# Patient Record
Sex: Male | Born: 1995 | Race: White | Hispanic: No | Marital: Single
Health system: Southern US, Community
[De-identification: ages and names within clinical notes are randomized; demographics above are authoritative.]

---
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
## 2015-08-31 IMAGING — CR DG CHEST 1V PORT
1 series · 1 of 1 positions shown · non-contrast
Comparison: Yesterday at 3909 hours

CLINICAL DATA: Pneumothorax, right chest tube in place.

EXAM:
PORTABLE CHEST - 1 VIEW

[ap]
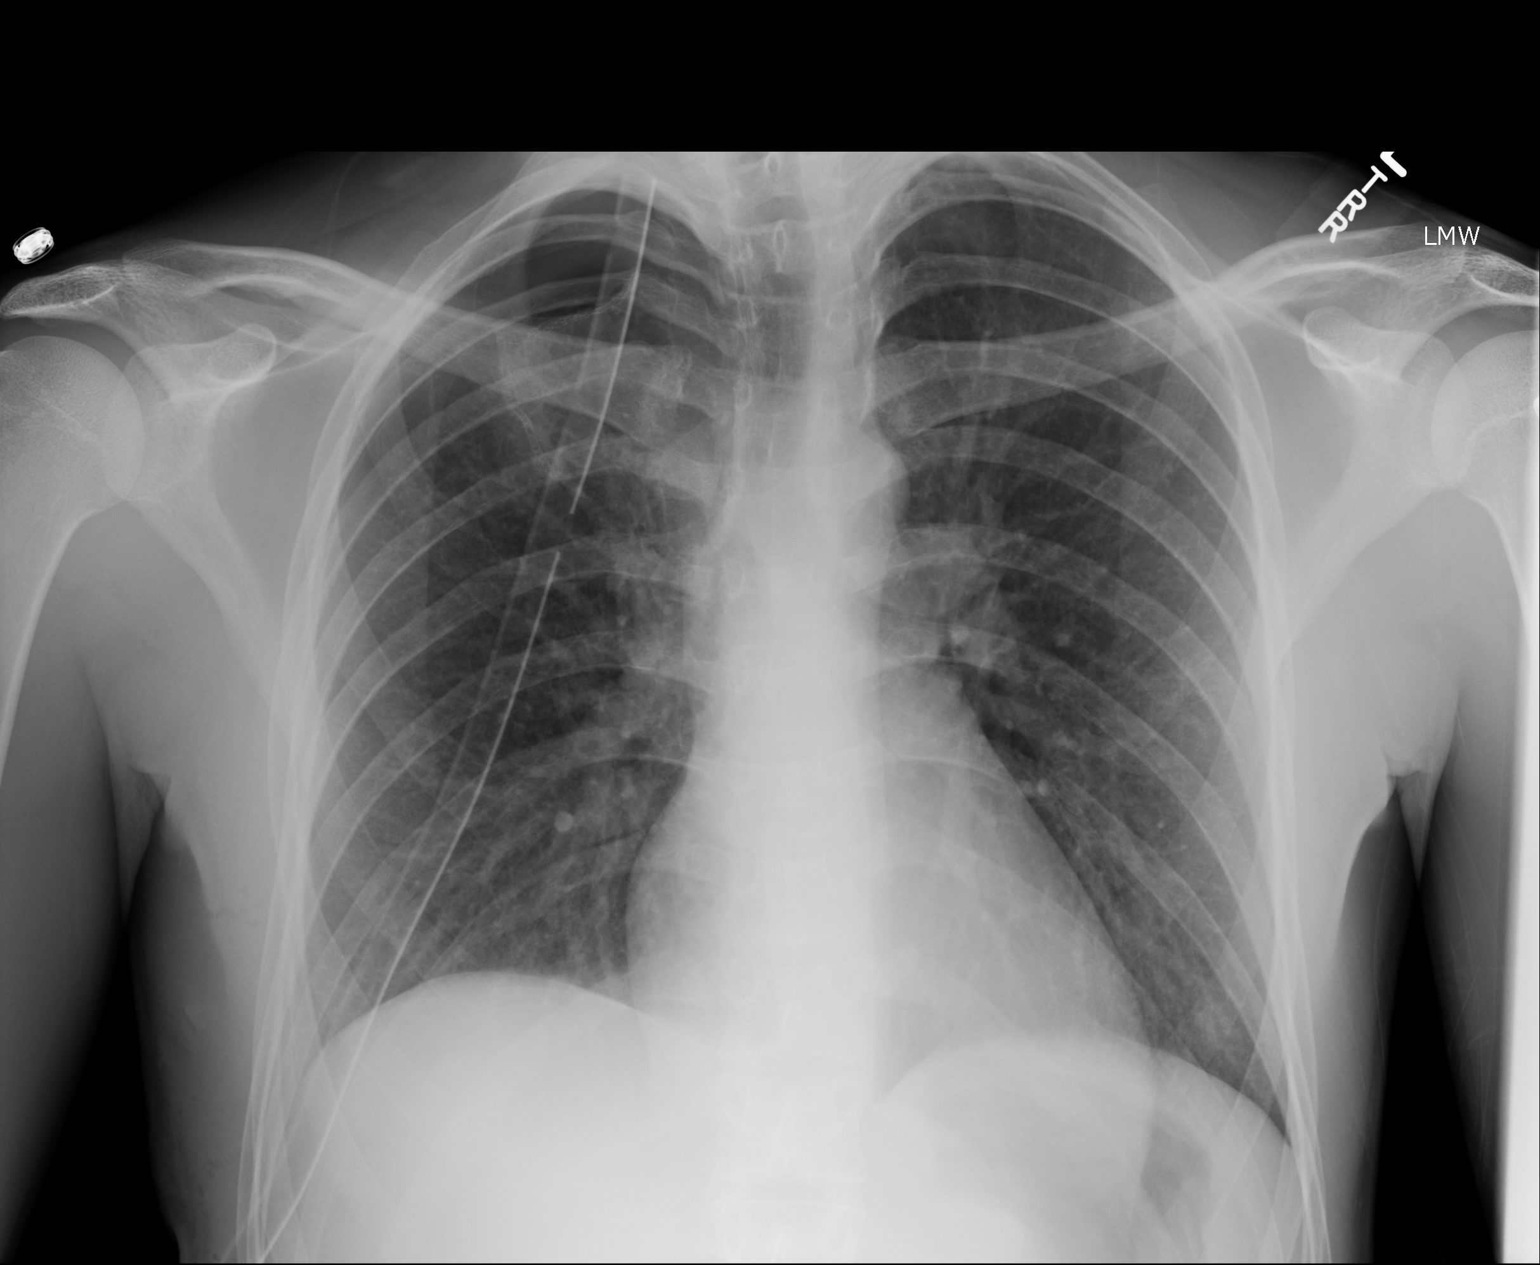

[1 of 1 positions shown; findings below may reference images not displayed]

FINDINGS: Right chest tube in place, tip at the apex. Chain sutures at the
right lung apex. Small apical pneumothorax remains small, however
has minimally increased from prior. There is no mediastinal shift.
The lungs are otherwise clear.
IMPRESSION: Small right apical pneumothorax, minimal increase in size compared
to prior exam. Right chest tube remains in place.

## 2015-09-11 IMAGING — CR DG CHEST 2V
1 series · 2 of 2 positions shown · non-contrast
Comparison: 05/23/2014

CLINICAL DATA: Right-sided chest pain for 2 hr after picking of the
neck. Recent pneumothorax.

EXAM:
CHEST  2 VIEW

[Series 1: dxr chest pa (or ap) and lateral · 0.14mm/px · 2 of 2 slices shown]
[im 1/2]
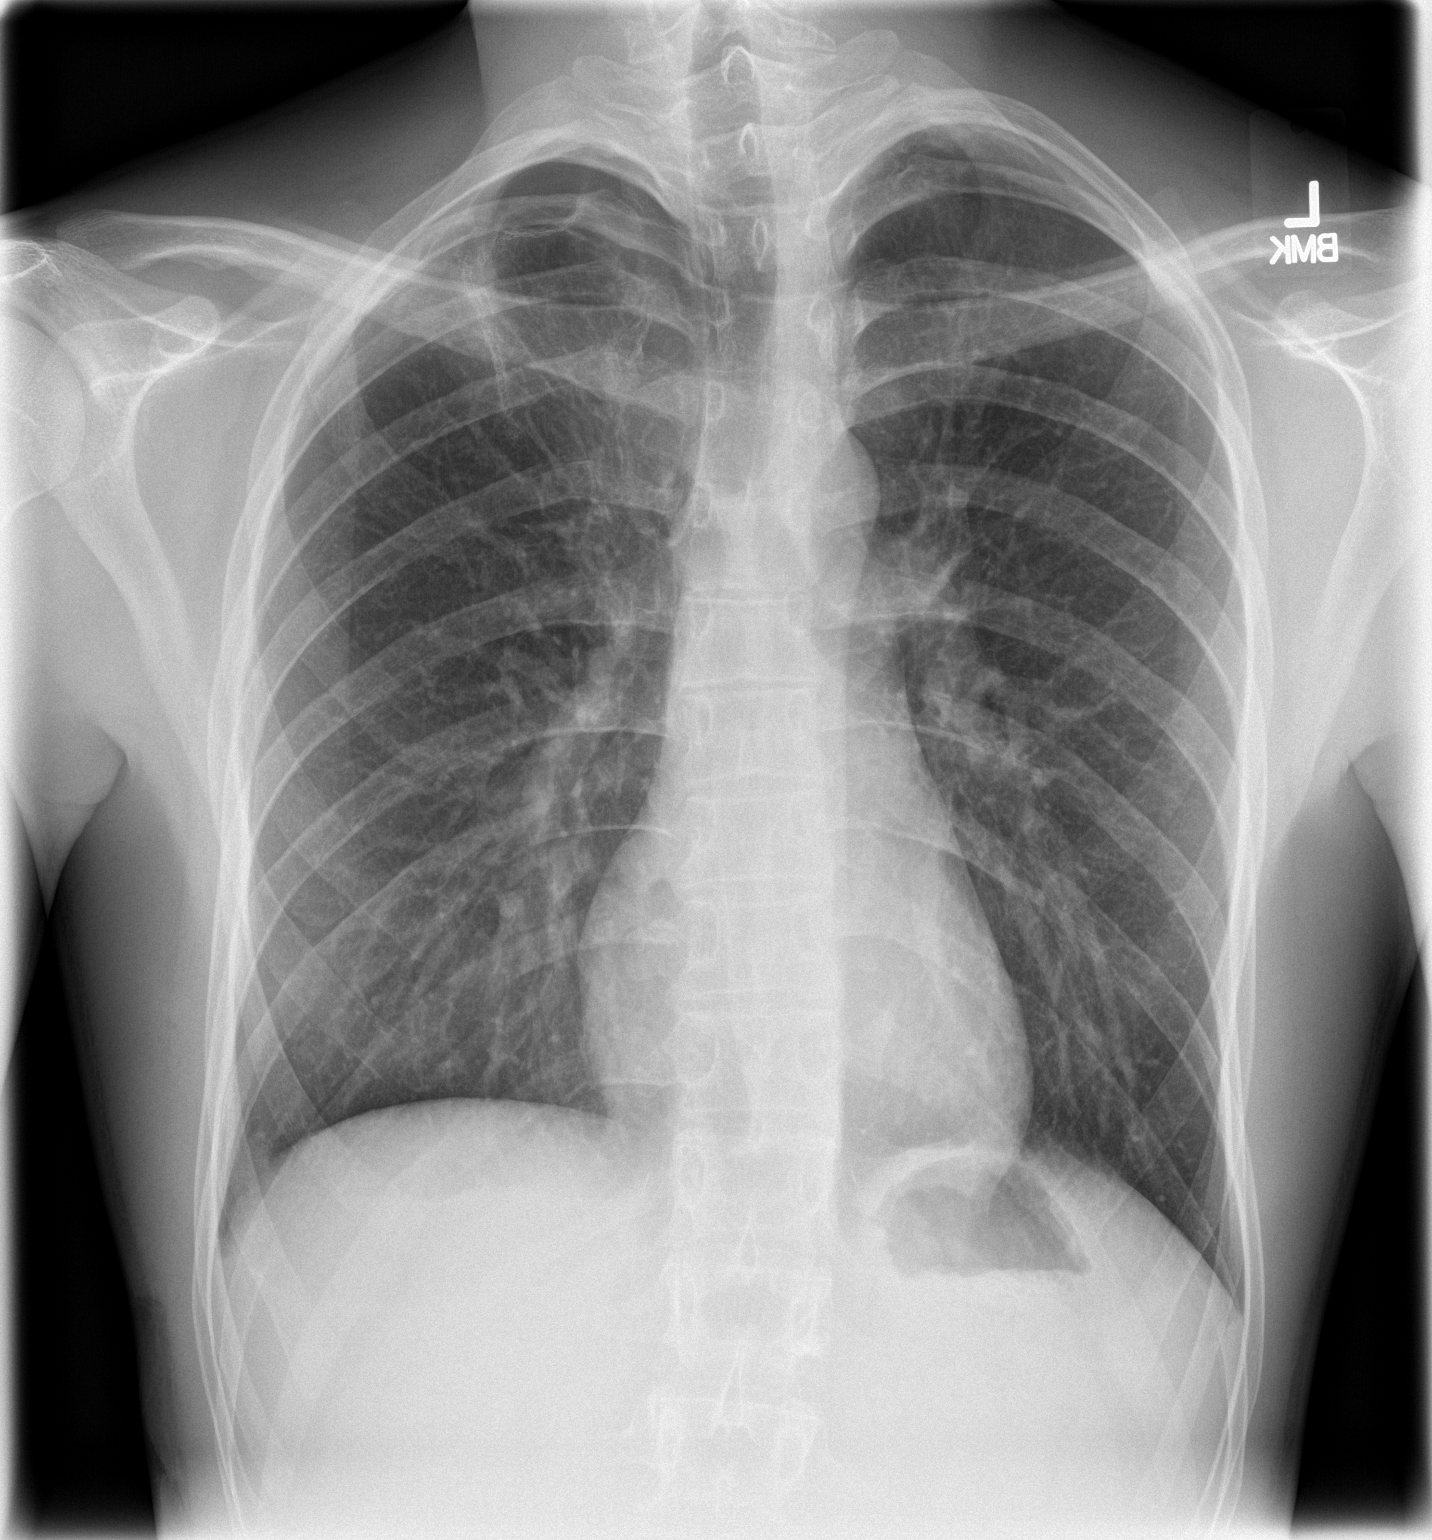
[im 2/2]
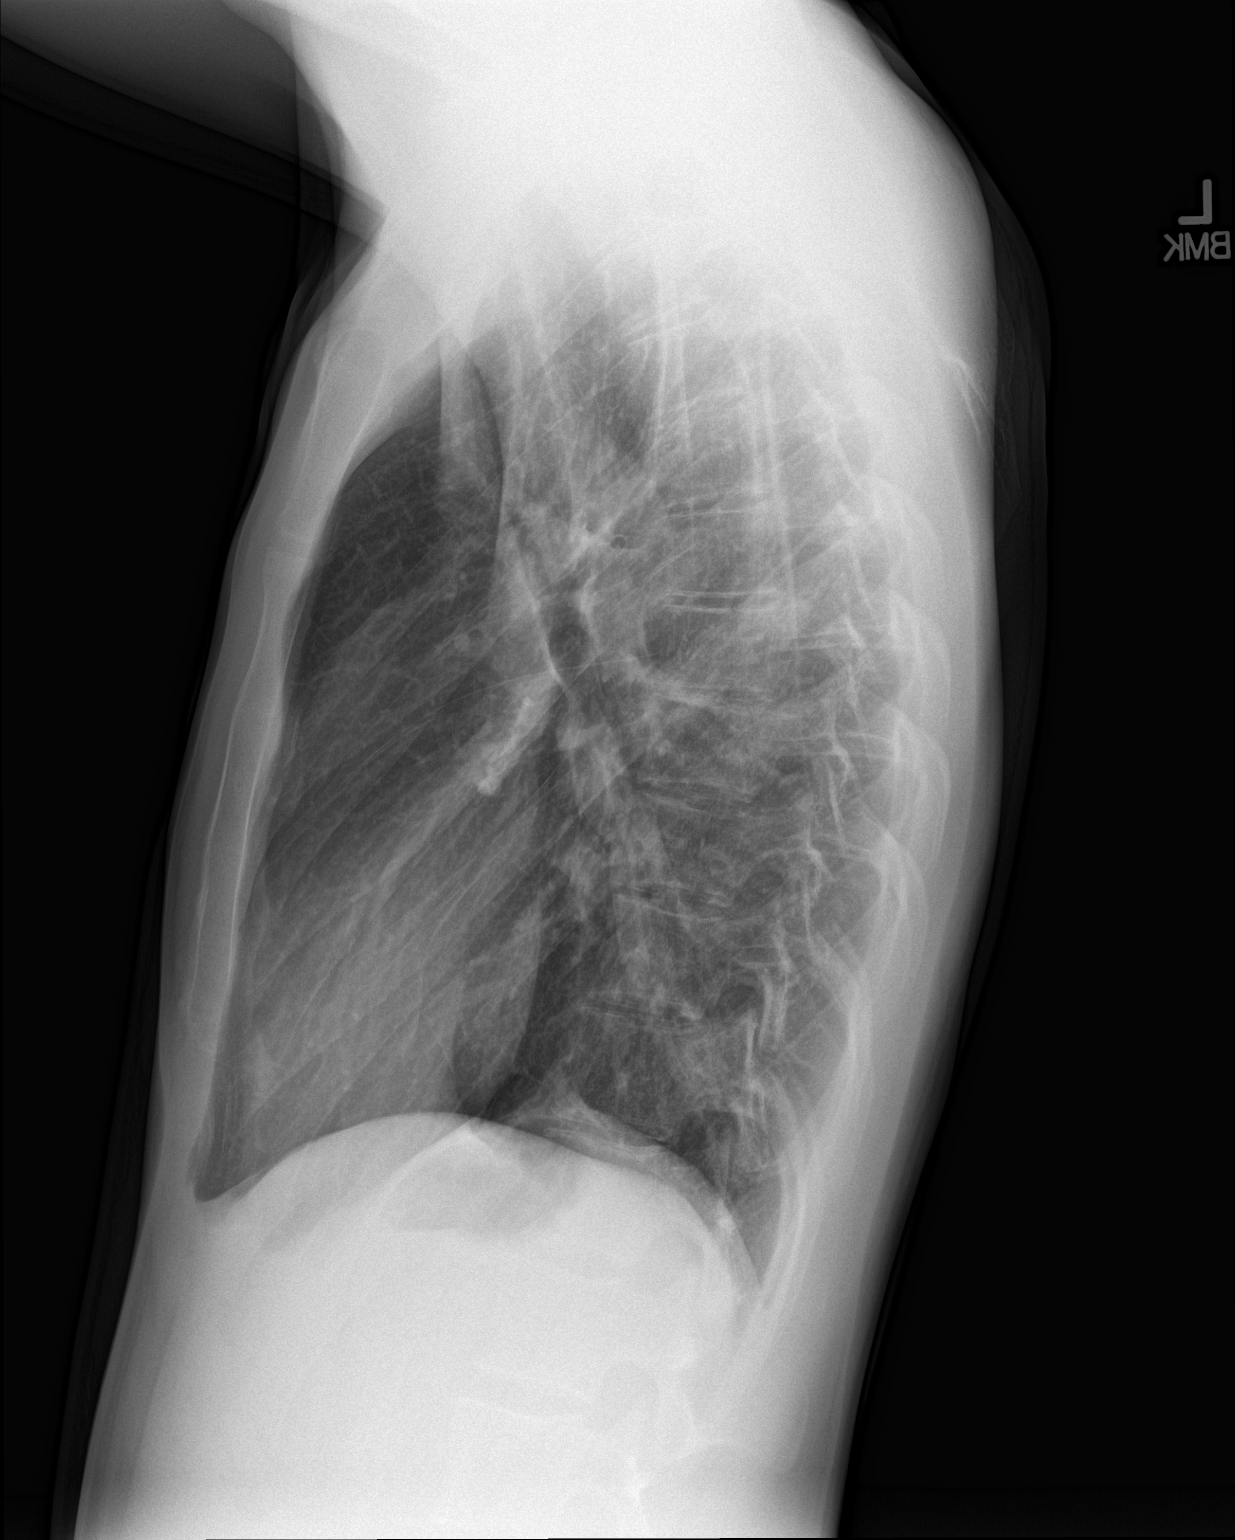

[2 of 2 positions shown; findings below may reference images not displayed]

FINDINGS: Trace right apical pneumothorax, less than 5%. Chain sutures noted
at the right apex.

Normal heart size and aortic contours.  Normal left lung.

No acute osseous findings.

Critical Value/emergent results were called by telephone at the time
of interpretation on 06/03/2014 at [DATE] to Dr. SERENA VITALE , who
verbally acknowledged these results.
IMPRESSION: Trace right apical pneumothorax.

## 2015-09-12 IMAGING — CR DG CHEST 2V
1 series · 2 of 2 positions shown · non-contrast
Comparison: 06/03/2014; 05/23/2014

CLINICAL DATA: History of pneumothorax, post bullectomy.

EXAM:
CHEST  2 VIEW

[Series 1: dxr chest pa (or ap) and lateral · 0.14mm/px · 2 of 2 slices shown]
[im 1/2]
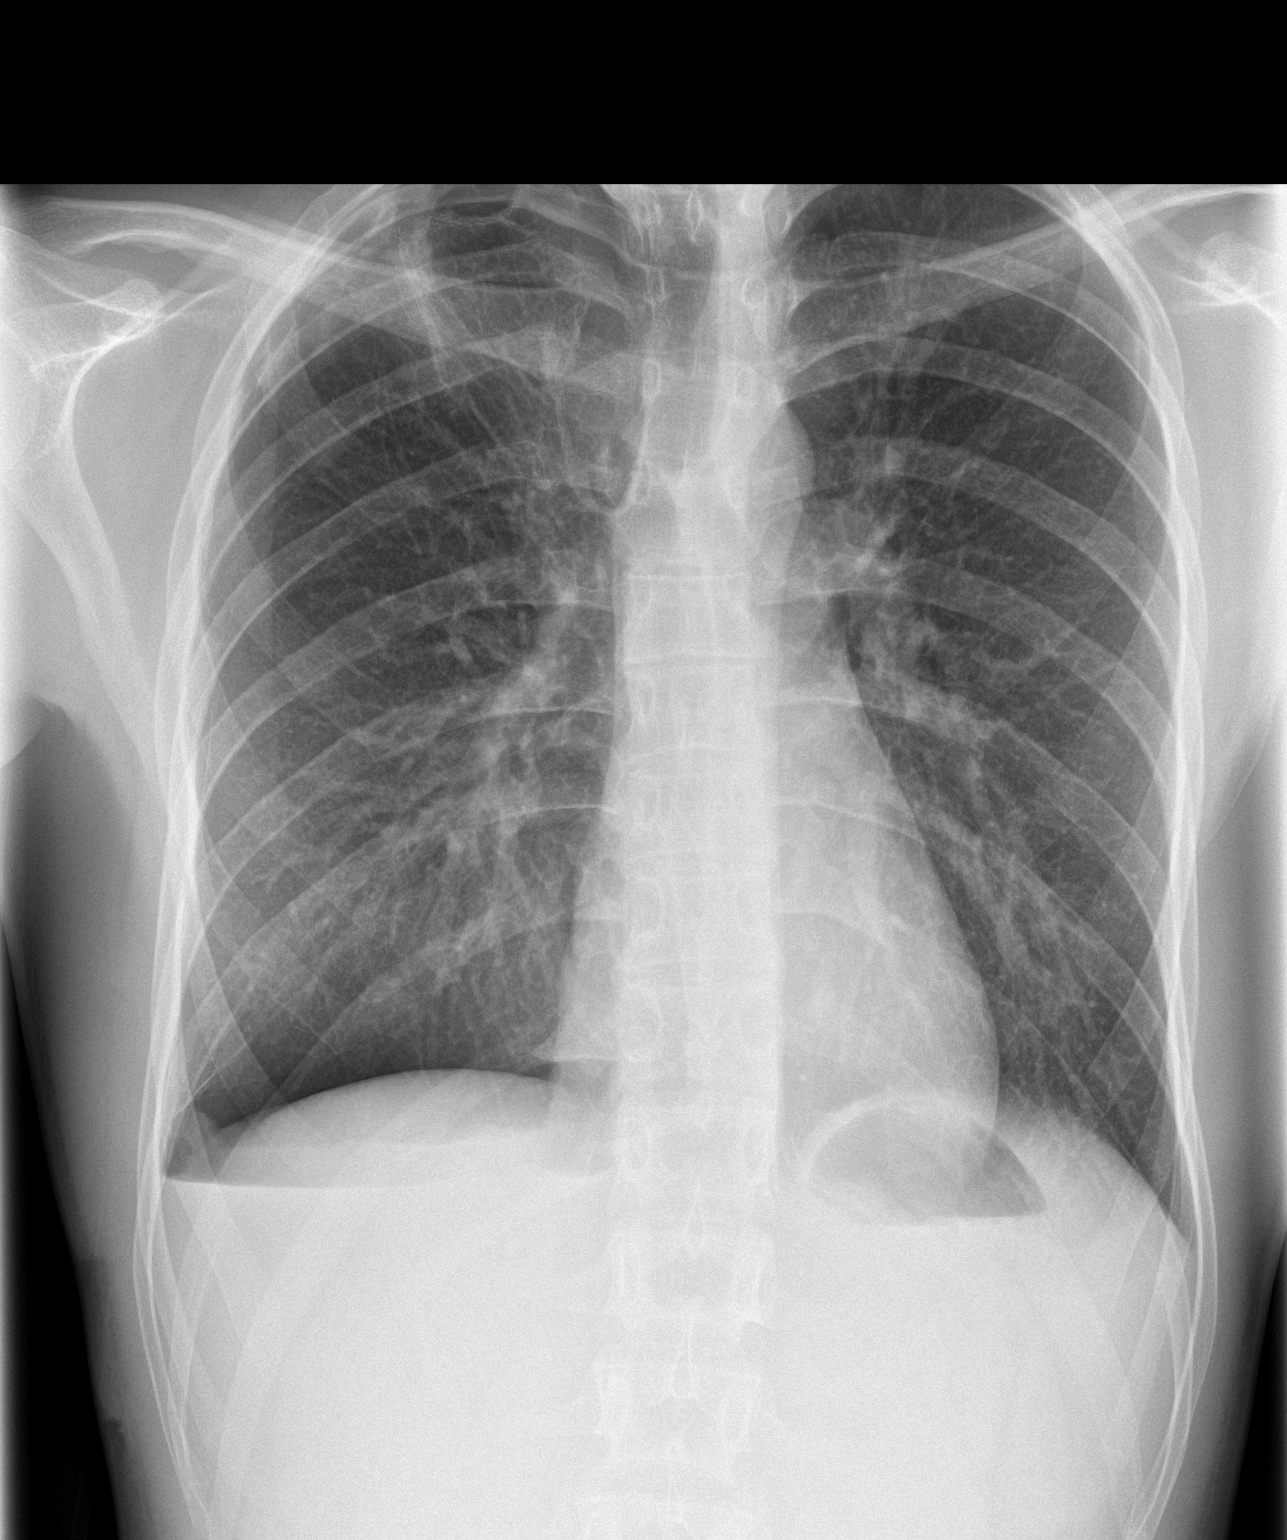
[im 2/2]
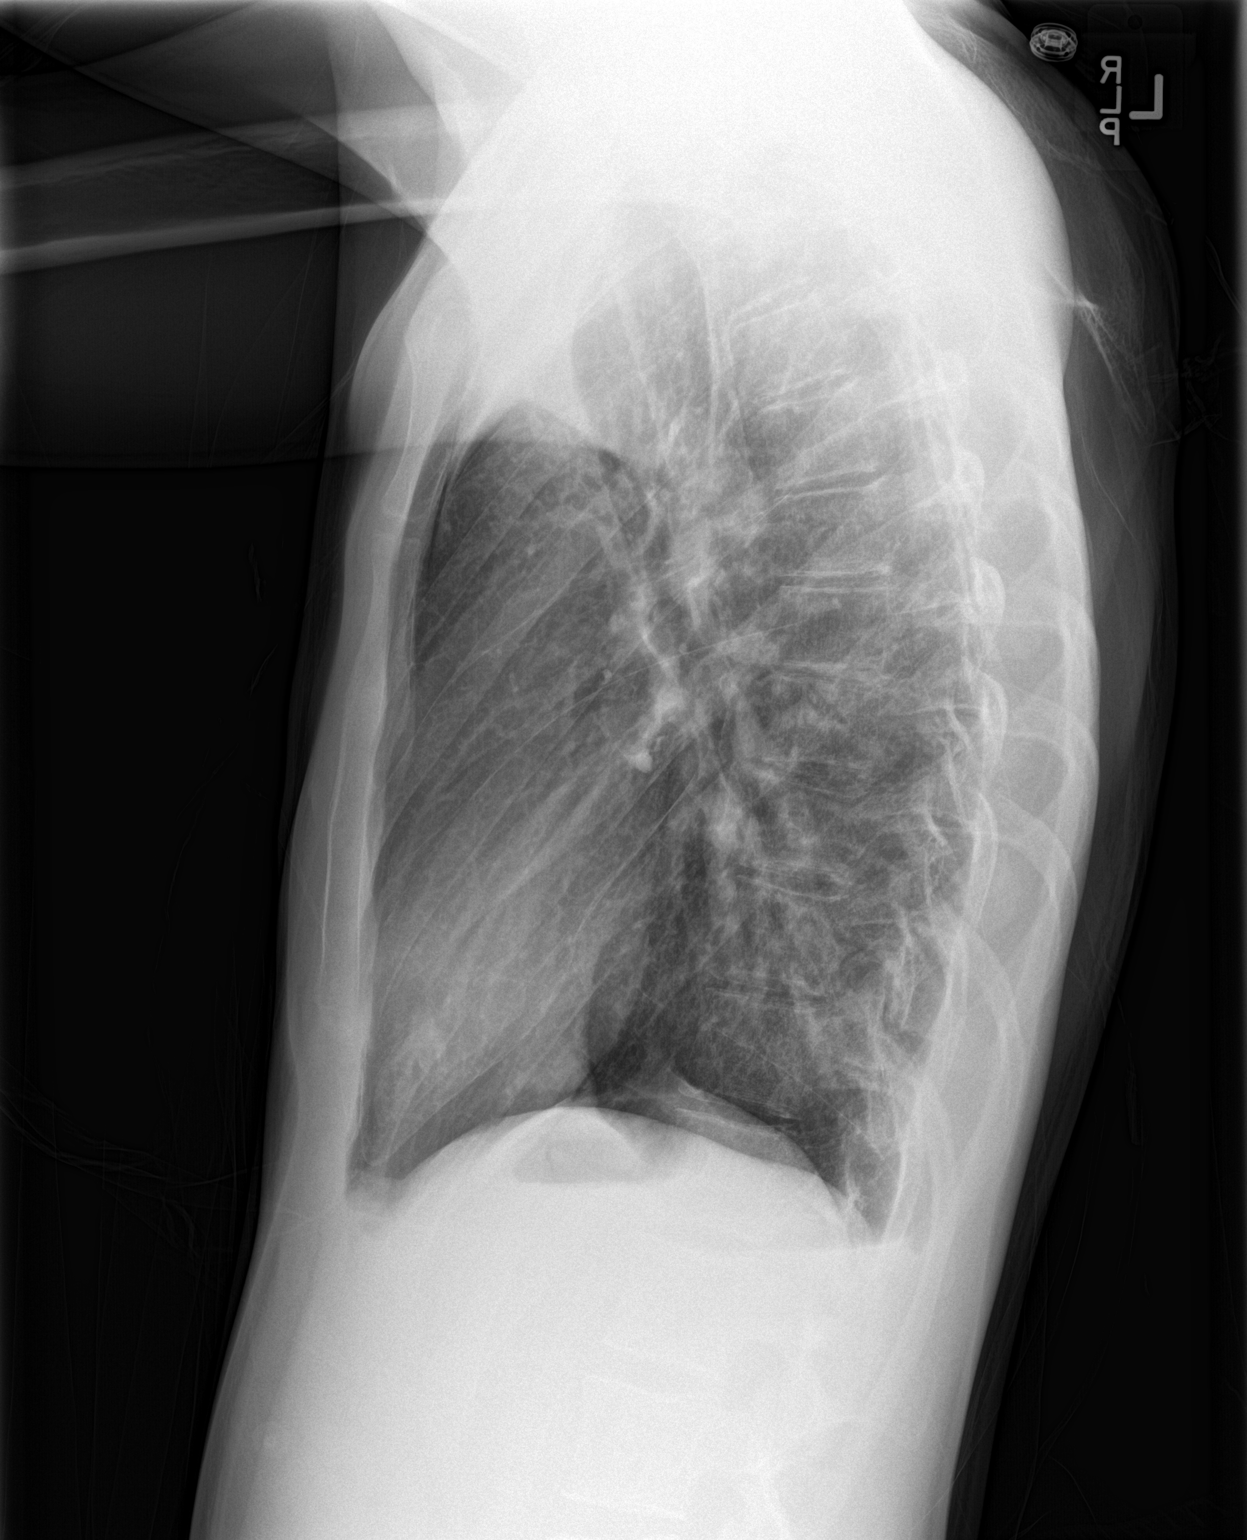

[2 of 2 positions shown; findings below may reference images not displayed]

FINDINGS: Grossly unchanged cardiac silhouette and mediastinal contours.
Stable postsurgical change of the right upper lobe. Potential
minimal increase in size of small right-sided hydro pneumothorax. No
definitive mediastinal shift. No discrete focal airspace opacities.
No evidence of edema. No acute osseus abnormalities.
IMPRESSION: Potential minimal increase in small right-sided hydro pneumothorax.

Above findings discussed with Dr. Winkel at [DATE].

## 2017-03-01 ENCOUNTER — Telehealth: Payer: Self-pay | Admitting: Cardiothoracic Surgery

## 2017-03-01 NOTE — Telephone Encounter (Signed)
[facility] and needs sutures removed. He states that he has seen Dr. Thelma Bargeaks in the past and was told to call here for the removal. I attempted to explain to him that we typically don't see someone for suture removal that was placed at another facility. He insisted that he knows Dr. Thelma Bargeaks would do it. Please advise.

## 2017-03-02 NOTE — Telephone Encounter (Signed)
Per Dr Thelma Bargeaks on 03/02/17 @ 9:15am-he has agreed to see patient in the office to remove sutures on 03/03/17 @ 8:15am. Patient has been contacted and advised.

## 2017-03-02 NOTE — Telephone Encounter (Signed)
Call made to patient at this time. Per Dr Thelma Bargeaks on 03/02/17 @ 9:15am-he has agreed to see patient in the office to remove sutures on 03/03/17 @ 8:15am. Patient has been contacted and advised.

## 2017-03-03 ENCOUNTER — Encounter: Payer: Self-pay | Admitting: Cardiothoracic Surgery

## 2017-03-03 ENCOUNTER — Ambulatory Visit (INDEPENDENT_AMBULATORY_CARE_PROVIDER_SITE_OTHER): Payer: BLUE CROSS/BLUE SHIELD | Admitting: Cardiothoracic Surgery

## 2017-03-03 VITALS — BP 120/75 | HR 80 | Temp 98.2°F | Ht >= 80 in | Wt 139.0 lb

## 2017-03-03 DIAGNOSIS — J9383 Other pneumothorax: Secondary | ICD-10-CM

## 2017-03-03 NOTE — Patient Instructions (Signed)
Please call our office if you have questions or concerns.   

## 2017-03-03 NOTE — Progress Notes (Signed)
  Patient ID: Tyler Shepherd, male   DOB: 07/21/1995, 22 y.o.   MRN: [id number]  HISTORY: He returns today in follow-up.  He is now about 2 years out from a right thoracoscopy apical blebectomy and mechanical pleurodesis for a spontaneous pneumothorax.  Over the Christmas holidays [place] when he again experienced the acute onset of left-sided chest discomfort which he describes as a pressure sensation medially.  After this did not resolve he [facility] where a chest x-ray showed a left-sided pneumothorax.  He had a chest tube inserted and was managed nonoperatively for several days while in the hospital and was ultimately discharged to home.  That was on 27 December.  He comes in today for follow-up.  He has a single remaining chest tube stitch.   Vitals:   03/03/17 0822  BP: 120/75  Pulse: 80  Temp: 98.2 F (36.8 C)  SpO2: 99%     EXAM:    Resp: Lungs are clear bilaterally.  No respiratory distress, normal effort. Heart:  Regular without murmurs Skin: Skin is warm and dry. No rash noted. No diaphoretic. No erythema. No pallor.  Psychiatric: Normal mood and affect. Normal behavior. Judgment and thought content normal.   His chest tube site is clean dry and intact.  There is no erythema or drainage.   ASSESSMENT: Spontaneous pneumothorax left side   PLAN:   We removed the single remaining suture.  We did not obtain a chest x-ray today as his lungs are clear and equal bilaterally.  He will continue his routine follow-up with his primary care physician.  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    Tyler Marinimothy Shanikia Kernodle, MD
